# Patient Record
Sex: Male | Born: 1955 | Race: White | Hispanic: No | Smoking: Former smoker
Health system: Southern US, Community
[De-identification: ages and names within clinical notes are randomized; demographics above are authoritative.]

## PROBLEM LIST (undated history)

## (undated) DIAGNOSIS — M545 Low back pain, unspecified: Secondary | ICD-10-CM

## (undated) DIAGNOSIS — G629 Polyneuropathy, unspecified: Secondary | ICD-10-CM

## (undated) HISTORY — DX: Low back pain, unspecified: M54.50

## (undated) HISTORY — DX: Polyneuropathy, unspecified: G62.9

## (undated) HISTORY — DX: Low back pain: M54.5

## (undated) HISTORY — PX: BACK SURGERY: SHX140

---

## 1998-07-28 ENCOUNTER — Ambulatory Visit (HOSPITAL_COMMUNITY): Admission: RE | Admit: 1998-07-28 | Discharge: 1998-07-29 | Payer: Self-pay | Admitting: Neurosurgery

## 1998-07-28 ENCOUNTER — Encounter: Payer: Self-pay | Admitting: Neurosurgery

## 1999-04-07 ENCOUNTER — Ambulatory Visit (HOSPITAL_COMMUNITY): Admission: RE | Admit: 1999-04-07 | Discharge: 1999-04-07 | Payer: Self-pay | Admitting: General Surgery

## 2001-05-14 ENCOUNTER — Encounter: Payer: Self-pay | Admitting: Emergency Medicine

## 2001-05-14 ENCOUNTER — Emergency Department (HOSPITAL_COMMUNITY): Admission: EM | Admit: 2001-05-14 | Discharge: 2001-05-14 | Payer: Self-pay | Admitting: Emergency Medicine

## 2017-04-25 ENCOUNTER — Other Ambulatory Visit: Payer: Self-pay | Admitting: Nurse Practitioner

## 2017-04-25 ENCOUNTER — Ambulatory Visit
Admission: RE | Admit: 2017-04-25 | Discharge: 2017-04-25 | Disposition: A | Discharge status: Home / Self Care | Payer: BLUE CROSS/BLUE SHIELD | Source: Ambulatory Visit | Attending: Nurse Practitioner | Admitting: Nurse Practitioner

## 2017-04-25 DIAGNOSIS — M5416 Radiculopathy, lumbar region: Secondary | ICD-10-CM

## 2017-04-25 DIAGNOSIS — G629 Polyneuropathy, unspecified: Secondary | ICD-10-CM

## 2017-04-30 ENCOUNTER — Encounter: Payer: Self-pay | Admitting: Neurology

## 2017-06-06 ENCOUNTER — Telehealth: Payer: Self-pay | Admitting: Neurology

## 2017-06-06 NOTE — Telephone Encounter (Signed)
Called and spoke with Mr. Nicholas Vaughn. He has a NP Consult on 06/22/17. I He states he paid out of pocket for the MRI and wanted to make sure we had access to it prior to his appointment. Confirmed the imaging study he was referencing was 04/15/17, and assured him we have access to see the images.

## 2017-06-06 NOTE — Telephone Encounter (Signed)
Patient wants to know if we got his MRI from [facility] that was done on feb 27th please call

## 2017-06-22 ENCOUNTER — Ambulatory Visit (INDEPENDENT_AMBULATORY_CARE_PROVIDER_SITE_OTHER): Payer: BLUE CROSS/BLUE SHIELD | Admitting: Neurology

## 2017-06-22 ENCOUNTER — Encounter: Payer: Self-pay | Admitting: Neurology

## 2017-06-22 VITALS — BP 138/90 | HR 98 | Ht 73.0 in | Wt 226.0 lb

## 2017-06-22 DIAGNOSIS — R29898 Other symptoms and signs involving the musculoskeletal system: Secondary | ICD-10-CM | POA: Diagnosis not present

## 2017-06-22 DIAGNOSIS — M5416 Radiculopathy, lumbar region: Secondary | ICD-10-CM | POA: Diagnosis not present

## 2017-06-22 DIAGNOSIS — R202 Paresthesia of skin: Secondary | ICD-10-CM

## 2017-06-22 MED ORDER — CYCLOBENZAPRINE HCL 5 MG PO TABS: 5.0000 mg | ORAL_TABLET | Freq: Every evening | ORAL | 0 refills | Status: DC | PRN

## 2017-06-22 NOTE — Progress Notes (Signed)
[facility] Clinic Note - Initial Visit   Date: 06/22/17  Nicholas EmmerRobert Zajicek MRN: [id number] DOB: 08/07/1955   Dear Dr. Otilio CarpenJumawid:  Thank you for your kind referral of Nicholas EmmerRobert Stork for consultation of left lumbar radiculopathy. Although his history is well known to you, please allow us to reiterate it for the purpose of our medical record. The patient was accompanied to the clinic by self.    History of Present Illness: Nicholas Vaughn is a 62 y.o. right-handed Caucasian male with lumbar surgery (2000) presenting for evaluation of left lumbar radiculopathy.    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  In February 2019, he returned from there and due to a rock slide was in the car for 5 hours, whereas normally the trip takes 3.5 hours.  The following morning, he woke up with low back pain radiated into the left sharp pain over the inner thigh and lower leg, numbness of the toes and and swelling leg.  He had similar symptoms in 2000 prior to having low back surgery by Dr. Dutch QuintPoole and knew it was stemming from his back. He saw his PCP who offered prednisone taper and ordered MRI lumbar spine which showed mild degenerative changes, mild left L3 and mild bilateral L4 nerve impingement. Symptoms started improving within one week of using using a copper wrist band and he has not had any more radicular pain since the end of March.  He continues to have sporadic numbness and tinging of the toes, especially with prolonged sitting. He denies weakness.  Out-side paper records, electronic medical record, and images have been reviewed where available and summarized as:  MRI lumbar spine 04/25/2017: 1. Multilevel lumbar disc degeneration mostly consisting of broad-based or circumferential disc bulging. Intermittent disc annular fissures are noted. Superimposed mostly mild lumbar degenerative facet hypertrophy. 2. Questionable  small left foraminal disc protrusion at the L3-L4 level. Query left L3 radiculitis.  3. There is also mild bilateral L4 neural foraminal stenosis, and mild bilateral lateral recess stenosis at both L4-L5 and L5-S1. No significant lumbar spinal stenosis.   Past Medical History:  Diagnosis Date  . Lumbar back pain   . Peripheral polyneuropathy     Past Surgical History:  Procedure Laterality Date  . BACK SURGERY       Medications:  Outpatient Encounter Medications as of 06/22/2017  Medication Sig  . celecoxib (CELEBREX) 200 MG capsule TAKE 1 CAPSULE BY MOUTH EVERY DAY AS NEEDED  . cyclobenzaprine (FLEXERIL) 5 MG tablet Take 1 tablet (5 mg total) by mouth at bedtime as needed for muscle spasms (low back pain).   No facility-administered encounter medications on file as of 06/22/2017.      Allergies:  Allergies  Allergen Reactions  . Cephalexin Hives  . Prednisone Hives    Family History: Family History  Problem Relation Age of Onset  . Healthy Son   . Neuropathy Neg Hx     Social History: Social History   Tobacco Use  . Smoking status: Former Games developermoker  . Smokeless tobacco: Never Used  Substance Use Topics  . Alcohol use: Yes    Comment: occasional  . Drug use: Never   Social History   Social History Narrative   Lives alone in a one story home.  Has one son.  Works as a Psychologist, occupationalwelder.  Education: high school.      Review of Systems:  CONSTITUTIONAL: No fevers, chills, night sweats, or  weight loss.   EYES: No visual changes or eye pain ENT: No hearing changes.  No history of nose bleeds.   RESPIRATORY: No cough, wheezing and shortness of breath.   CARDIOVASCULAR: Negative for chest pain, and palpitations.   GI: Negative for abdominal discomfort, blood in stools or black stools.  No recent change in bowel habits.   GU:  No history of incontinence.   MUSCLOSKELETAL: No history of joint pain or swelling.  No myalgias.   SKIN: Negative for lesions, rash, and itching.     HEMATOLOGY/ONCOLOGY: Negative for prolonged bleeding, bruising easily, and swollen nodes.  No history of cancer.   ENDOCRINE: Negative for cold or heat intolerance, polydipsia or goiter.   PSYCH:  No depression or anxiety symptoms.   NEURO: As Above.   Vital Signs:  BP 138/90   Pulse 98   Ht 6\' 1"  (1.854 m)   Wt 226 lb (102.5 kg)   SpO2 96%   BMI 29.82 kg/m    General Medical Exam:   General:  Well appearing, comfortable.   Eyes/ENT: see cranial nerve examination.   Neck: No masses appreciated.  Full range of motion without tenderness.  No carotid bruits. Respiratory:  Clear to auscultation, good air entry bilaterally.   Cardiac:  Regular rate and rhythm, no murmur.   Extremities:  No deformities, edema, or skin discoloration.  Skin:  No rashes or lesions.  Neurological Exam: MENTAL STATUS including orientation to time, place, person, recent and remote memory, attention span and concentration, language, and fund of knowledge is normal.  Speech is not dysarthric.  CRANIAL NERVES: II:  No visual field defects.  Unremarkable fundi.   III-IV-VI: Pupils equal round and reactive to light.  Normal conjugate, extra-ocular eye movements in all directions of gaze.  No nystagmus.  No ptosis.   V:  Normal facial sensation.     VII:  Normal facial symmetry and movements.  No pathologic facial reflexes.  VIII:  Normal hearing and vestibular function.   IX-X:  Normal palatal movement.   XI:  Normal shoulder shrug and head rotation.   XII:  Normal tongue strength and range of motion, no deviation or fasciculation.  MOTOR:  Right FDI and ADM atrophy, no fasciculations or abnormal movements.  No pronator drift.  Tone is normal.    Right Upper Extremity:    Left Upper Extremity:    Deltoid  5/5   Deltoid  5/5   Biceps  5/5   Biceps  5/5   Triceps  5/5   Triceps  5/5   Wrist extensors  5/5   Wrist extensors  5/5   Wrist flexors  5/5   Wrist flexors  5/5   Finger extensors  5/5   Finger  extensors  5/5   Finger flexors  5/5   Finger flexors  5/5   Dorsal interossei  4/5   Dorsal interossei  5/5   Abductor pollicis  5/5   Abductor pollicis  5/5   Tone (Ashworth scale)  0  Tone (Ashworth scale)  0   Right Lower Extremity:    Left Lower Extremity:    Hip flexors  5/5   Hip flexors  5/5   Hip extensors  5/5   Hip extensors  5/5   Knee flexors  5/5   Knee flexors  5/5   Knee extensors  5/5   Knee extensors  5/5   Dorsiflexors  5/5   Dorsiflexors  5/5   Plantarflexors  5/5  Plantarflexors  5/5   Toe extensors  5/5   Toe extensors  5/5   Toe flexors  5/5   Toe flexors  5/5   Tone (Ashworth scale)  0  Tone (Ashworth scale)  0   MSRs:  Right                                                                 Left brachioradialis 2+  brachioradialis 2+  biceps 2+  biceps 2+  triceps 2+  triceps 2+  patellar 2+  patellar 1+  ankle jerk 2+  ankle jerk 2+  Hoffman no  Hoffman no  plantar response down  plantar response down   SENSORY:  Normal and symmetric perception of light touch, pinprick, vibration, and proprioception.  Romberg's sign absent.   COORDINATION/GAIT: Normal finger-to- nose-finger.  Intact rapid alternating movements bilaterally.  Gait narrow based and stable. Tandem and stressed gait intact.    IMPRESSION: 1.  Left lumbar radiculopathy - improved. MRI lumbar spine personally viewed which shows mild degenerative changes, mild left L3 and mild bilateral L4 nerve impingement.  Recommend that he start low back strengthening and stretching exercises.  Prescription for flexeril 5mg  at bedtime as needed given for acute low back pain   2.  Right hand weakness and atrophy.  Possibly due to ulnar neuropathy at the wrist given his occupational activity of being an Psychologist, occupational and using hand tools.  NCS/EMG declined at this time, he will call the office if weakness gets worse.  3.  Subjective bilateral feet paresthesias.  Exam is normal which is reassuring.  No history of  diabetes, alcohol use, or thyroid problems. If symptoms get worse, the next step is NCS/EMG to determine if this could be early peripheral neuropathy  Return to clinic in 1 year.    Thank you for allowing me to participate in patient's care.  If I can answer any additional questions, I would be pleased to do so.    Sincerely,    Donika K. Allena Katz, DO

## 2017-06-22 NOTE — Patient Instructions (Addendum)
Start home exercises for low back stretching and strengthening  For severe low back pain, you can take flexeril 5mg  at bedtime as needed  If you right gets weaker or new numbness/tingling or if the tingling in your toes gets worse, please call my office and we can set up nerve testing  Return to clinic in 1 year

## 2017-07-23 ENCOUNTER — Other Ambulatory Visit: Payer: Self-pay | Admitting: Neurology

## 2017-08-13 ENCOUNTER — Ambulatory Visit: Payer: BLUE CROSS/BLUE SHIELD | Admitting: Neurology

## 2017-08-13 ENCOUNTER — Encounter

## 2017-11-02 ENCOUNTER — Encounter: Payer: Self-pay | Admitting: Neurology

## 2018-06-24 ENCOUNTER — Ambulatory Visit: Payer: BLUE CROSS/BLUE SHIELD | Admitting: Neurology

## 2019-11-08 IMAGING — MR MR LUMBAR SPINE W/O CM
4 of 5 series · 17 of 48 positions shown · non-contrast
Comparison: None.

CLINICAL DATA: 61-year-old male with lumbar back pain for 3 weeks.
Edema, pain and swelling involving the left leg, and both feet. No
known injury.

EXAM:
MRI LUMBAR SPINE WITHOUT CONTRAST
TECHNIQUE: Multiplanar, multisequence MR imaging of the lumbar spine was
performed. No intravenous contrast was administered.

[Series 6: T2 · sagittal · 4.0mm · 0.73mm/px · 5 of 15 slices shown (1 of 2)]
[im 1/15]
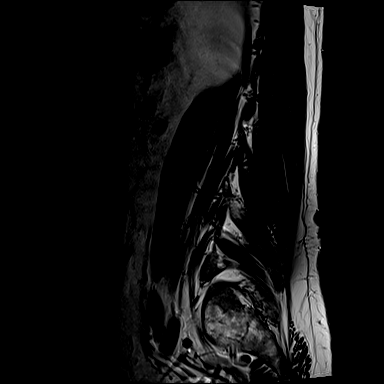
[im 4/15]
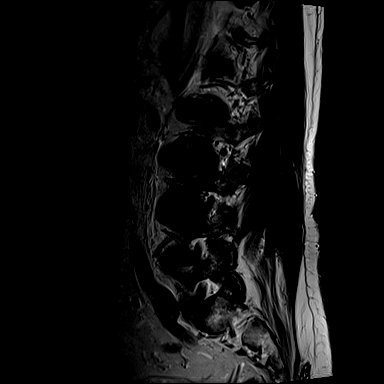
[im 8/15]
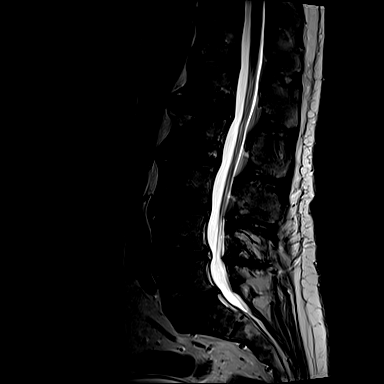
[im 11/15]
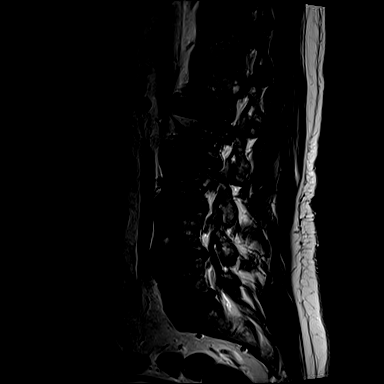
[im 15/15]
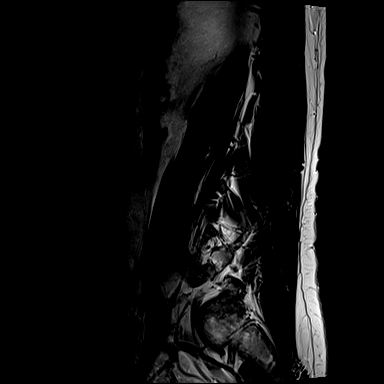

[Series 7: T1 · sagittal · 4.0mm · 0.73mm/px · 3 of 15 slices shown (1 of 2)]
[im 1/15]
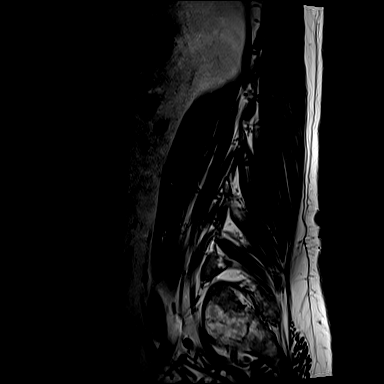
[im 8/15]
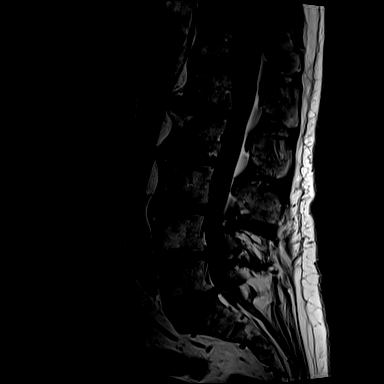
[im 15/15]
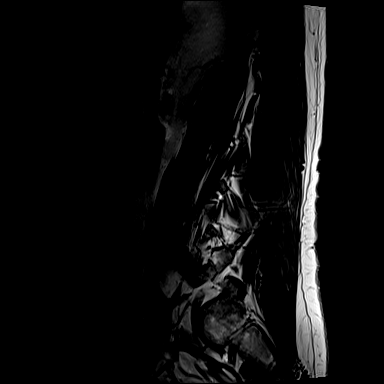

[Series 11: T1 · axial · 4.0mm · 0.28mm/px · z∈[-23,+136]mm · 3 of 42 slices shown (2 of 2)]
[im 6/42]
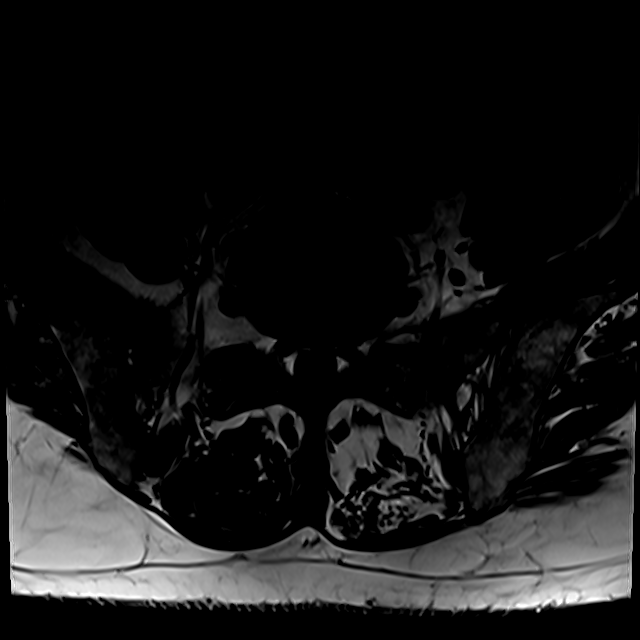
[im 22/42]
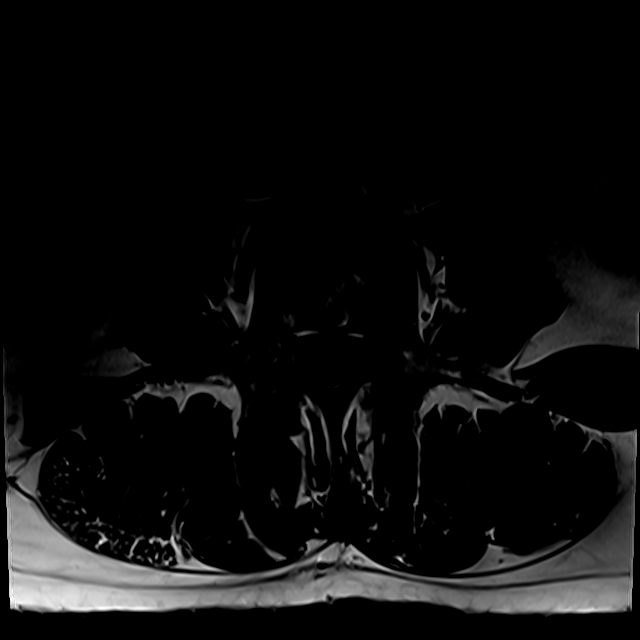
[im 36/42]
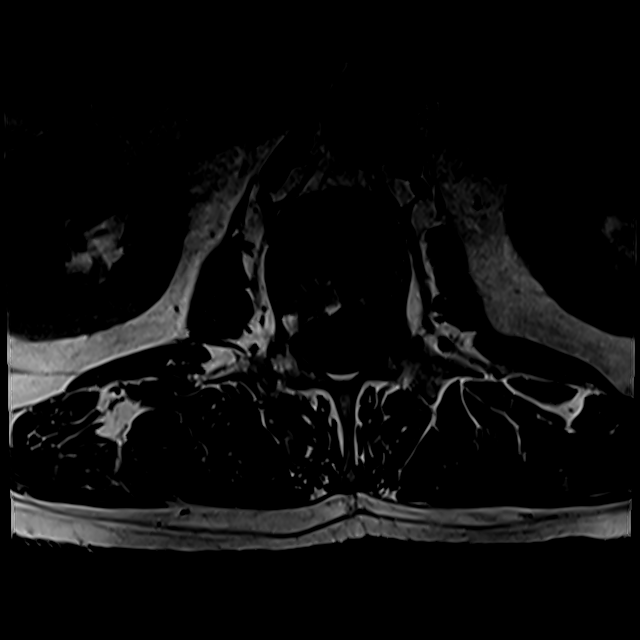

[Series 14: T2 · axial · 4.0mm · 0.28mm/px · z∈[-38,+136]mm · 6 of 42 slices shown (2 of 2)]
[im 3/42]
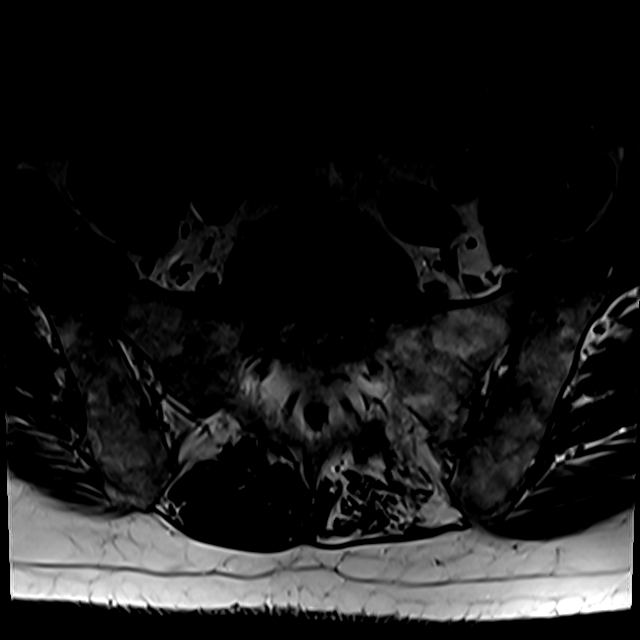
[im 6/42]
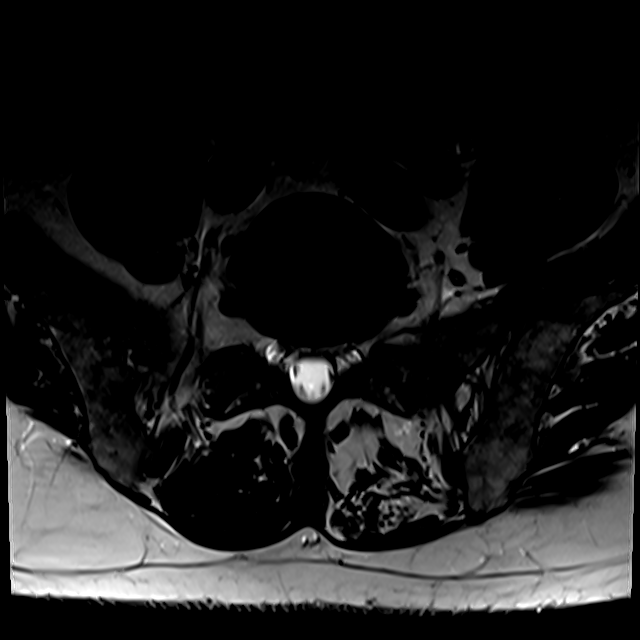
[im 9/42]
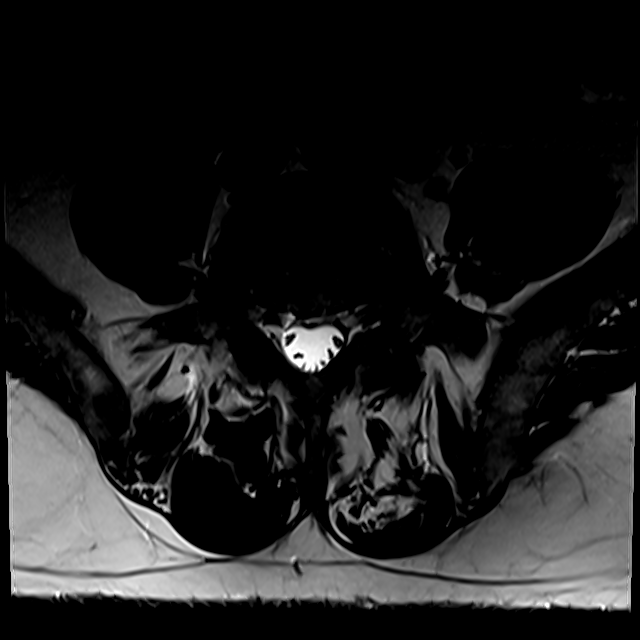
[im 14/42]
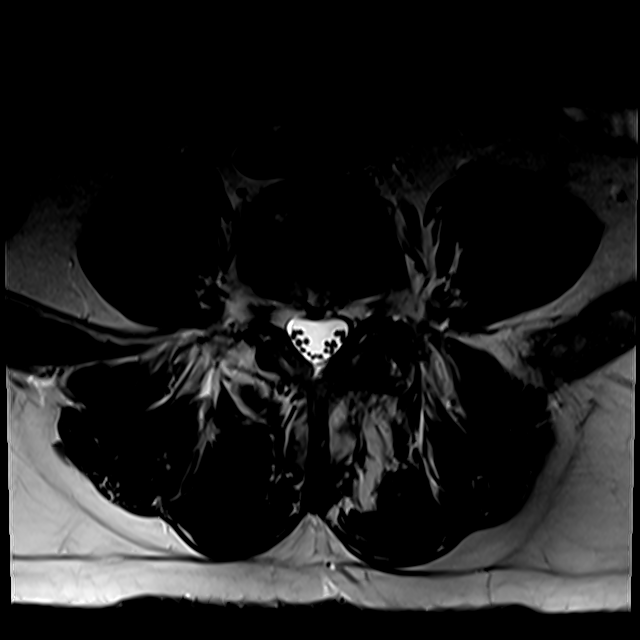
[im 22/42]
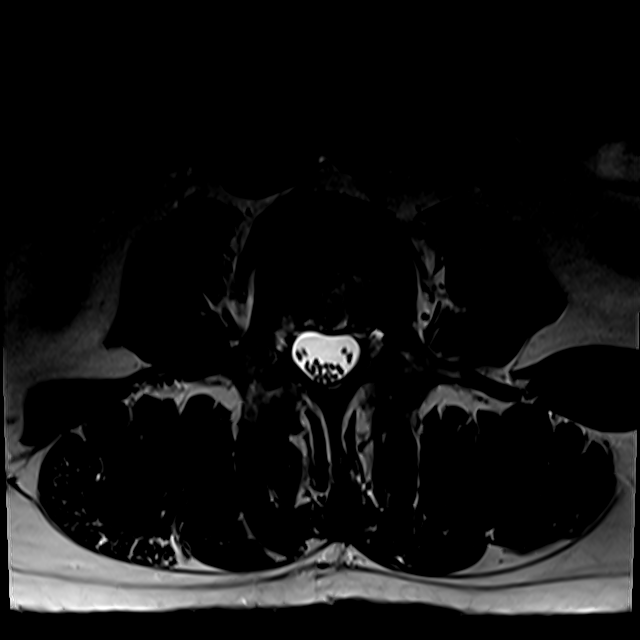
[im 36/42]
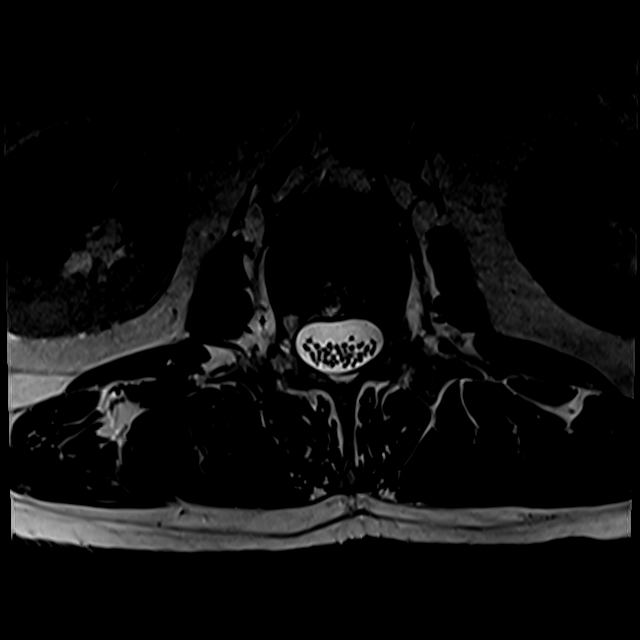

[17 of 48 positions shown; findings below may reference images not displayed]

FINDINGS: Segmentation: Lumbar segmentation appears to be normal and will be
designated as such for this report.

Alignment: Lumbar lordosis is within normal limits. No
spondylolisthesis.

Vertebrae: Chronic degenerative endplate marrow signal changes at
L1-L2 and L2-L3. There is a small round benign appearing sclerotic
lesion in the anterior L3 vertebral body measuring 10 millimeters
which is dark on all sequences. Background bone marrow signal is
within normal limits. No marrow edema or evidence of acute osseous
abnormality. Intact visible sacrum and SI joints.

Conus medullaris and cauda equina: Conus extends to the T12-L1
level. Conus and cauda equina appear normal.

Paraspinal and other soft tissues: Negative.

Disc levels:

T11-T12: Negative.

T12-L1:  Negative.

L1-L2: Disc desiccation and circumferential but mostly anterior disc
bulge. Small right paracentral annular fissure (series 14, image
10). Mild endplate spurring. No significant stenosis.

L2-L3: Mild disc desiccation with right eccentric circumferential
disc bulge. Broad-based right subarticular component with mild
endplate spurring. No spinal or lateral recess stenosis. Borderline
to mild right L2 neural foraminal stenosis.

L3-L4: Mild circumferential disc bulge with questionable small
superimposed left foraminal disc protrusion (series 14, image 24).
Superimposed mild facet hypertrophy. Right side degenerative trace
facet joint fluid. Mild endplate spurring. No spinal or lateral
recess stenosis. Mild left L3 neural foraminal stenosis.

L4-L5: Mild disc desiccation. Circumferential disc bulge with
broad-based posterior component and central annular fissure (series
14, image 31). Superimposed endplate spurring. Broad-based bilateral
foraminal involvement. Mild facet hypertrophy. Trace degenerative
right facet joint fluid. Mild spinal stenosis. Mild right greater
than left lateral recess stenosis at the descending L5 nerve levels.
Mild bilateral L4 neural foraminal stenosis.

L5-S1: Mild circumferential disc bulge and endplate spurring. Mild
facet hypertrophy. No spinal stenosis. Borderline to mild bilateral
lateral recess stenosis at the descending S1 nerve levels. No
foraminal stenosis.
IMPRESSION: 1. Multilevel lumbar disc degeneration mostly consisting of
broad-based or circumferential disc bulging. Intermittent disc
annular fissures are noted. Superimposed mostly mild lumbar
degenerative facet hypertrophy.
2. Questionable small left foraminal disc protrusion at the L3-L4
level. Query left L3 radiculitis.
3. There is also mild bilateral L4 neural foraminal stenosis, and
mild bilateral lateral recess stenosis at both L4-L5 and L5-S1. No
significant lumbar spinal stenosis.

## 2021-11-17 ENCOUNTER — Ambulatory Visit (INDEPENDENT_AMBULATORY_CARE_PROVIDER_SITE_OTHER): Payer: Self-pay

## 2021-11-17 DIAGNOSIS — Z23 Encounter for immunization: Secondary | ICD-10-CM

## 2022-10-03 DIAGNOSIS — K080 Exfoliation of teeth due to systemic causes: Secondary | ICD-10-CM | POA: Diagnosis not present

## 2022-10-19 DIAGNOSIS — K080 Exfoliation of teeth due to systemic causes: Secondary | ICD-10-CM | POA: Diagnosis not present

## 2022-11-07 DIAGNOSIS — K080 Exfoliation of teeth due to systemic causes: Secondary | ICD-10-CM | POA: Diagnosis not present

## 2022-12-26 DIAGNOSIS — K080 Exfoliation of teeth due to systemic causes: Secondary | ICD-10-CM | POA: Diagnosis not present

## 2023-01-09 DIAGNOSIS — K080 Exfoliation of teeth due to systemic causes: Secondary | ICD-10-CM | POA: Diagnosis not present

## 2023-05-10 DIAGNOSIS — K080 Exfoliation of teeth due to systemic causes: Secondary | ICD-10-CM | POA: Diagnosis not present

## 2023-05-30 DIAGNOSIS — K080 Exfoliation of teeth due to systemic causes: Secondary | ICD-10-CM | POA: Diagnosis not present

## 2023-12-13 DIAGNOSIS — K080 Exfoliation of teeth due to systemic causes: Secondary | ICD-10-CM | POA: Diagnosis not present
